# Patient Record
Sex: Male | Born: 1965 | Race: White | Hispanic: No | Marital: Married
Health system: Southern US, Community
[De-identification: ages and names within clinical notes are randomized; demographics above are authoritative.]

---

## 2004-07-01 ENCOUNTER — Emergency Department: Payer: Self-pay | Admitting: Emergency Medicine

## 2004-07-15 ENCOUNTER — Emergency Department: Payer: Self-pay | Admitting: Emergency Medicine

## 2007-11-17 ENCOUNTER — Emergency Department: Payer: Self-pay | Admitting: Emergency Medicine

## 2009-02-09 ENCOUNTER — Emergency Department: Payer: Self-pay | Admitting: Emergency Medicine

## 2009-07-11 ENCOUNTER — Emergency Department: Payer: Self-pay | Admitting: Emergency Medicine

## 2009-08-23 ENCOUNTER — Inpatient Hospital Stay: Payer: Self-pay | Admitting: Psychiatry

## 2011-03-22 IMAGING — CR DG CHEST 1V PORT
1 series · 1 of 1 positions shown · non-contrast
Comparison: none

REASON FOR EXAM: cough
COMMENTS:

PROCEDURE:     DXR - DXR PORTABLE CHEST SINGLE VIEW  - August 23, 2009  [DATE]
RESULT:     Comparison: None

[view not recorded]
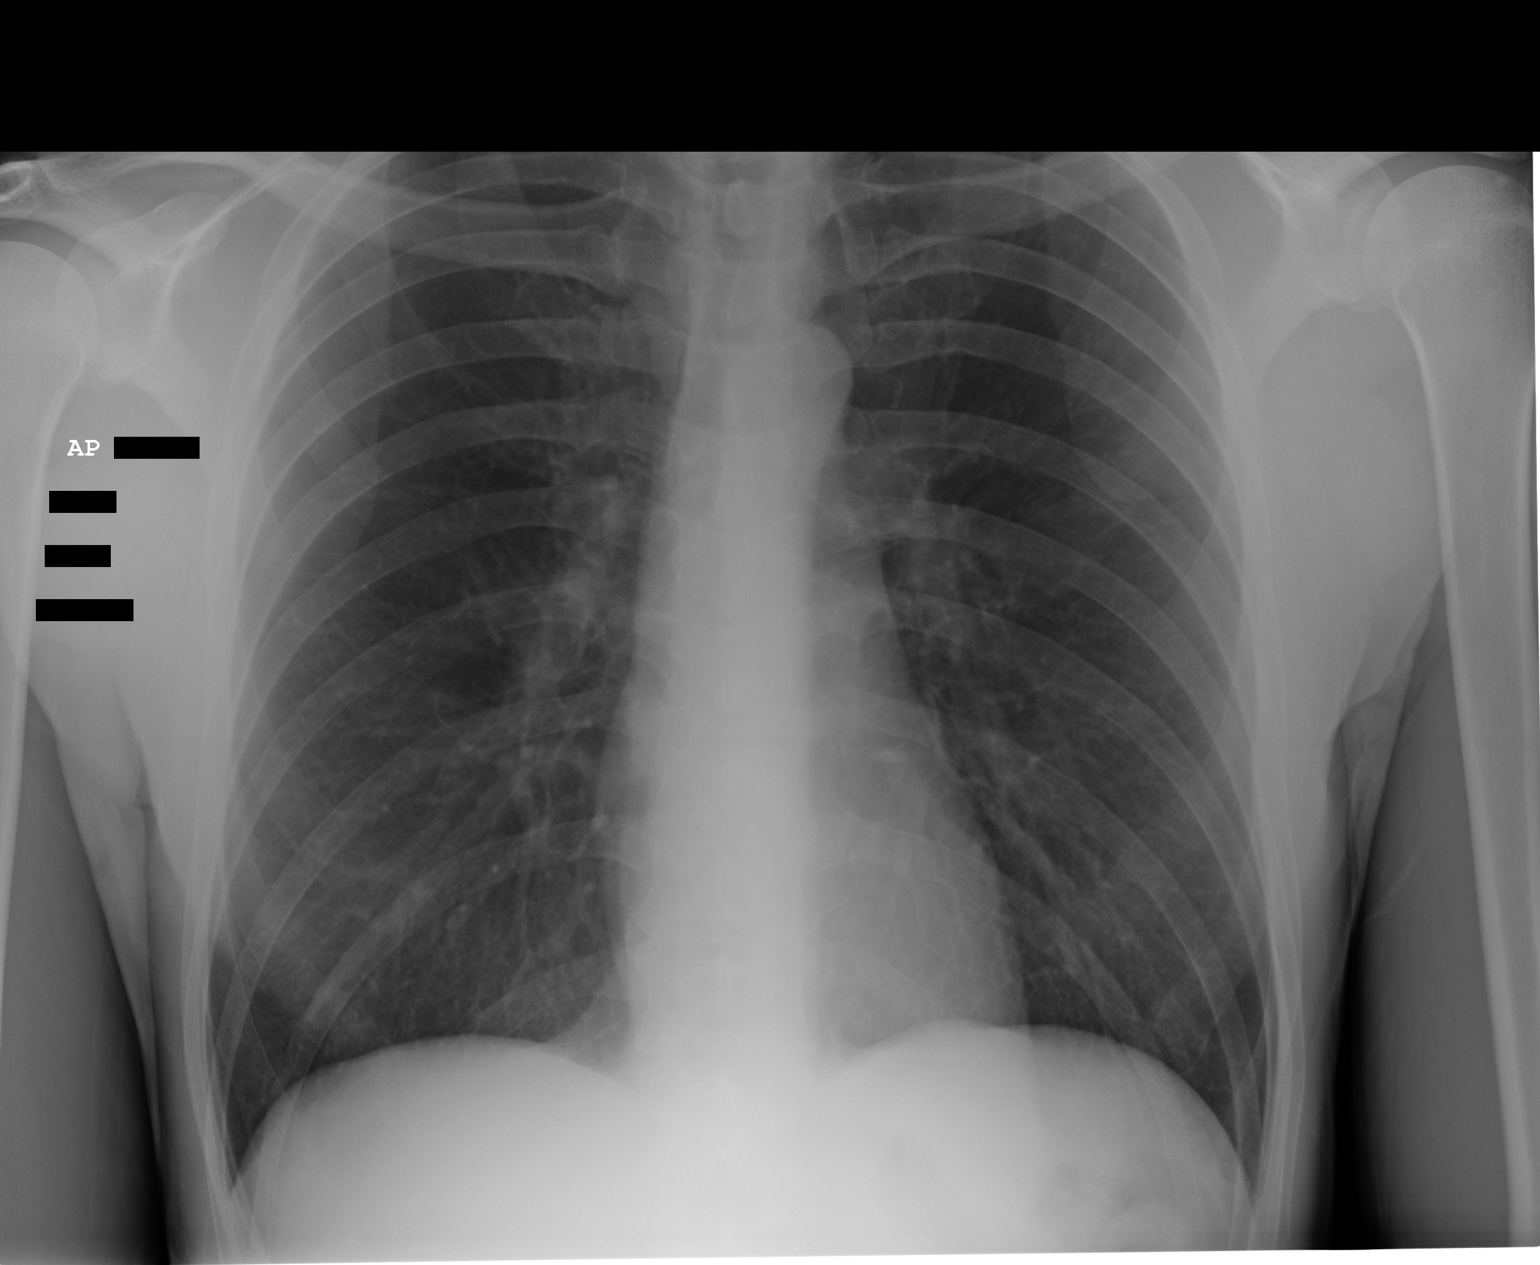

[1 of 1 positions shown; findings below may reference images not displayed]

FINDINGS: Single portable AP chest radiograph is provided. There is no focal
parenchymal opacity, pleural effusion, or pneumothorax. Normal
cardiomediastinal silhouette. The osseous structures are unremarkable.
IMPRESSION: No acute disease of the chest.

## 2012-05-15 ENCOUNTER — Emergency Department: Payer: Self-pay | Admitting: Emergency Medicine

## 2013-02-24 LAB — COMPREHENSIVE METABOLIC PANEL
Albumin: 4 g/dL (ref 3.4–5.0)
Anion Gap: 7 (ref 7–16)
BUN: 13 mg/dL (ref 7–18)
Calcium, Total: 7.9 mg/dL — ABNORMAL LOW (ref 8.5–10.1)
Chloride: 115 mmol/L — ABNORMAL HIGH (ref 98–107)
EGFR (African American): >60
EGFR (Non-African Amer.): >60
Osmolality: 291 (ref 275–301)
SGPT (ALT): 23 U/L (ref 12–78)
Sodium: 145 mmol/L (ref 136–145)
Total Protein: 7.2 g/dL (ref 6.4–8.2)

## 2013-02-24 LAB — DRUG SCREEN, URINE
Amphetamines, Ur Screen: NEGATIVE (ref ?–1000)
Barbiturates, Ur Screen: NEGATIVE (ref ?–200)
Benzodiazepine, Ur Scrn: NEGATIVE (ref ?–200)
Cannabinoid 50 Ng, Ur ~~LOC~~: NEGATIVE (ref ?–50)
MDMA (Ecstasy)Ur Screen: NEGATIVE (ref ?–500)
Tricyclic, Ur Screen: NEGATIVE (ref ?–1000)

## 2013-02-24 LAB — CBC
HGB: 15.2 g/dL (ref 13.0–18.0)
RDW: 15.5 % — ABNORMAL HIGH (ref 11.5–14.5)
WBC: 5 10*3/uL (ref 3.8–10.6)

## 2013-02-24 LAB — URINALYSIS, COMPLETE
Bacteria: NONE SEEN
Bilirubin,UR: NEGATIVE
Blood: NEGATIVE
Leukocyte Esterase: NEGATIVE
Nitrite: NEGATIVE
Ph: 5 (ref 4.5–8.0)
Protein: 30

## 2013-02-24 LAB — ETHANOL
Ethanol %: 0.095 % — ABNORMAL HIGH (ref 0.000–0.080)
Ethanol: 95 mg/dL

## 2013-02-24 LAB — ACETAMINOPHEN LEVEL: Acetaminophen: <2 ug/mL

## 2013-02-25 ENCOUNTER — Inpatient Hospital Stay: Payer: Self-pay | Admitting: Psychiatry
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
